# Patient Record
Sex: Male | Born: 2005 | Race: White | Hispanic: No | Marital: Single
Health system: Southern US, Community
[De-identification: ages and names within clinical notes are randomized; demographics above are authoritative.]

## PROBLEM LIST (undated history)

## (undated) DIAGNOSIS — F909 Attention-deficit hyperactivity disorder, unspecified type: Secondary | ICD-10-CM

## (undated) DIAGNOSIS — R569 Unspecified convulsions: Secondary | ICD-10-CM

## (undated) DIAGNOSIS — J45909 Unspecified asthma, uncomplicated: Secondary | ICD-10-CM

## (undated) DIAGNOSIS — J302 Other seasonal allergic rhinitis: Secondary | ICD-10-CM

## (undated) HISTORY — DX: Unspecified convulsions: R56.9

## (undated) HISTORY — PX: PYLOROPLASTY: SHX418

## (undated) HISTORY — DX: Unspecified asthma, uncomplicated: J45.909

## (undated) HISTORY — DX: Attention-deficit hyperactivity disorder, unspecified type: F90.9

## (undated) HISTORY — DX: Other seasonal allergic rhinitis: J30.2

---
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
## 2024-02-01 ENCOUNTER — Encounter (HOSPITAL_COMMUNITY): Payer: Self-pay

## 2024-02-01 ENCOUNTER — Other Ambulatory Visit: Payer: Self-pay

## 2024-02-01 ENCOUNTER — Emergency Department (HOSPITAL_COMMUNITY)
Admission: EM | Admit: 2024-02-01 | Discharge: 2024-02-01 | Disposition: A | Discharge status: Home / Self Care | Payer: MEDICAID | Attending: Emergency Medicine | Admitting: Emergency Medicine

## 2024-02-01 ENCOUNTER — Emergency Department (HOSPITAL_COMMUNITY): Payer: MEDICAID

## 2024-02-01 ENCOUNTER — Other Ambulatory Visit (HOSPITAL_COMMUNITY): Payer: MEDICAID

## 2024-02-01 DIAGNOSIS — W131XXA Fall from, out of or through bridge, initial encounter: Secondary | ICD-10-CM | POA: Insufficient documentation

## 2024-02-01 DIAGNOSIS — J45909 Unspecified asthma, uncomplicated: Secondary | ICD-10-CM | POA: Insufficient documentation

## 2024-02-01 DIAGNOSIS — S52522A Torus fracture of lower end of left radius, initial encounter for closed fracture: Secondary | ICD-10-CM | POA: Diagnosis not present

## 2024-02-01 DIAGNOSIS — S80212A Abrasion, left knee, initial encounter: Secondary | ICD-10-CM | POA: Insufficient documentation

## 2024-02-01 DIAGNOSIS — R2232 Localized swelling, mass and lump, left upper limb: Secondary | ICD-10-CM | POA: Diagnosis present

## 2024-02-01 MED ORDER — ACETAMINOPHEN 500 MG PO TABS
1000.0000 mg | ORAL_TABLET | Freq: Once | ORAL | Status: AC
  Administered 2024-02-01: 1000 mg via ORAL
  Filled 2024-02-01: qty 2

## 2024-02-01 NOTE — ED Triage Notes (Signed)
 PT BIB EMS for a fall from a crossmember of the bridge about 6 feet and landed on rocks.  Left arm swollen and in a splint, pulses noted, able to move fingers, no obvious deformity.  Left knee c/o pain, pulses noted, pelvis and abdomen intact, no loss of consciousness, no head or neck pain.  75 mcg Fentanyl IM 135/81 HR 104  sinus tach CBG 125

## 2024-02-01 NOTE — Discharge Instructions (Signed)
 Thank you for coming to [facility] Emergency Department. You were seen for fall and wrist pain. We did an exam, labs, and imaging, and these showed an impacted radial head fracture (wrist fracture). You were placed in a splint.  Please follow up with orthopedic surgeon Dr. Annamae Barrett within 1 week. You can call his clinic tomorrow morning to make an appointment.  Please rest, ice, and elevate the hand/arm to help with pain and swelling. You can alternate taking Tylenol and ibuprofen as needed for pain. You can take 650mg  tylenol (acetaminophen) every 4-6 hours, and 600 mg ibuprofen 3 times a day.   Do not hesitate to return to the ED or call 911 if you experience: -Worsening symptoms -Numbness/tingling -Cold, blue, numb hand/fingers -Lightheadedness, passing out -Fevers/chills -Anything else that concerns you

## 2024-02-01 NOTE — Progress Notes (Signed)
 Orthopedic Tech Progress Note Patient Details:  Tyler [name] [birth year] [id number]  Ortho Devices Type of Ortho Device: Volar splint Ortho Device/Splint Location: lue Ortho Device/Splint Interventions: Ordered, Application, Adjustment   Post Interventions Patient Tolerated: Well Instructions Provided: Care of device, Adjustment of device  [name] 02/01/2024, 10:23 PM

## 2024-02-01 NOTE — ED Provider Notes (Signed)
 [place] EMERGENCY DEPARTMENT AT [facility] Provider Note   CSN: [id number] Arrival date & time: 02/01/24  1829     History  Chief Complaint  Patient presents with   Tyler [name]    Tyler [name] is a 18 y.o. adult with PMH as listed below who presents BIB EMS for a fall from a crossmember of the bridge about 6 feet and landed on rocks.  Left arm swollen and in a splint by EMS who noted pulses.  Patient reports left knee pain as well.  Has not walked since the incident.  Denies any head trauma or loss of consciousness.  Denies any headache, neck pain, back pain, chest or abdominal pain. 75 mcg Fentanyl IM given PTA.   Past Medical History:  Diagnosis Date   ADHD (attention deficit hyperactivity disorder)    Asthma    Seasonal allergies    Seizures (HCC)        Home Medications Prior to Admission medications   Medication Sig Start Date End Date Taking? Authorizing Provider  cetirizine  HCl (ZYRTEC ) 5 MG/5ML SYRP Take 5 mLs (5 mg total) by mouth daily. 01/20/14   Gaylyn Keas, Mary-Margaret, FNP  ibuprofen (ADVIL,MOTRIN) 200 MG tablet Take 200 mg by mouth every 6 (six) hours as needed for moderate pain.    [provider]  lisdexamfetamine (VYVANSE ) 50 MG capsule Take 1 capsule (50 mg total) by mouth every morning. 06/09/14   Gaylyn Keas, Mary-Margaret, FNP  MELATONIN PO Take 1 tablet by mouth at bedtime.    [provider]  montelukast (SINGULAIR) 5 MG chewable tablet Chew 5 mg by mouth at bedtime.  09/24/16 09/24/17  [provider]  ondansetron  (ZOFRAN ) 4 MG tablet Take 1 tablet (4 mg total) by mouth every 8 (eight) hours as needed for nausea or vomiting. 04/03/23   Rutherford Cowing, MD  PROAIR HFA 108 (90 Base) MCG/ACT inhaler Inhale 2 puffs into the lungs every 4 (four) hours as needed for wheezing. 01/23/17   [provider]  VYVANSE  60 MG capsule Take 60 mg by mouth daily. 02/01/17   [provider]      Allergies    Patient has no allergy  information on record.    Review of Systems   Review of Systems A 10 point review of systems was performed and is negative unless otherwise reported in HPI.  Physical Exam Updated Vital Signs BP 110/67   Pulse 69   Temp 98.3 F (36.8 C) (Oral)   Resp 15   SpO2 98%  Physical Exam General: Normal appearing patient, lying in bed.  HEENT: NCAT. PERRLA, Sclera anicteric, MMM, trachea midline. No midline C spine TTP Cardiology: RRR, no murmurs/rubs/gallops.  Resp: Normal respiratory rate and effort. CTAB, no wheezes, rhonchi, crackles.  Abd: Soft, non-tender, non-distended. No rebound tenderness or guarding.  Pelvis: Pelvis stable and nontender MSK: Tenderness palpation to the left wrist and forearm with mild swelling noted around the wrist.  Range of motion of the elbow and wrist limited due to pain but no tenderness palpation over the elbow, upper arm, shoulder, or clavicle.  Intact distal pulses and sensation with good range of motion of the fingers.  Also mild tender palpation to the anterior left knee with an abrasion that is hemostatic.  Intact range of motion of knee/hip/ankle. Compartments soft in all extremities. Skin: warm, dry.  Back: No CVA tenderness. No midline T or L spine TTP Neuro: A&Ox4, CNs II-XII grossly intact. MAEs. Sensation grossly intact.  Psych:  Normal mood and affect.   ED Results / Procedures / Treatments   Labs (all labs ordered are listed, but only abnormal results are displayed) Labs Reviewed - No data to display  EKG None  Radiology CXR: 1. Low lung volumes. No acute process.   LUE XRs: 1. Mildly impacted fracture of the distal left radius, with slight dorsal angulation at the fracture site. 2. Dorsal soft tissue swelling of the wrist. 3. Otherwise unremarkable left elbow and forearm.  L Knee XR: Unremarkable left knee.   Procedures Procedures    Medications Ordered in ED Medications  acetaminophen  (TYLENOL ) tablet 1,000 mg (1,000 mg  Oral Given 02/01/24 1916)    ED Course/ Medical Decision Making/ A&P                          Medical Decision Making Amount and/or Complexity of Data Reviewed Radiology: ordered. Decision-making details documented in ED Course.  Risk OTC drugs.    This patient presents to the ED for concern of fall, arm/knee pain, this involves an extensive number of treatment options, and is a complaint that carries with it a high risk of complications and morbidity.  I considered the following differential and admission for this acute, potentially life threatening condition. Pt overall very well-appearing, HDS.   MDM:    DDX for trauma includes but is not limited to:  -Head Injury such as skull fx or ICH; vertebral injury - patient rules out by canadian head ct and c-spine criteria. -Fractures -No c/f compartment syndrome, NVI in all extremities.   Clinical Course as of 02/05/24 2001  Sun Feb 01, 2024  2044 DG Knee Complete 4 Views Left 1. Unremarkable left knee. [HN]  2044 DG Elbow Complete Left 1. Mildly impacted fracture of the distal left radius, with slight dorsal angulation at the fracture site. 2. Dorsal soft tissue swelling of the wrist. 3. Otherwise unremarkable left elbow and forearm.   [HN]  2044 DG Chest Portable 1 View 1. Low lung volumes.  No acute process. [HN]    Clinical Course User Index [HN] Merdis Stalling, MD   Patient with mild torus fracture and slight dorsal angulation at the fracture site.  Patient is given a volar splint and instructed to follow-up with orthopedic surgery within 1 week.  Mother at bedside as well.  Instructed for RICE and Tylenol /Motrin at home.   Imaging Studies ordered: I ordered imaging studies including extremity XRs, CXR I independently visualized and interpreted imaging. I agree with the radiologist interpretation  Additional history obtained from chart review, EMS.    Reevaluation: After the interventions noted above, I  reevaluated the patient and found that they have :improved  Social Determinants of Health: Lives independently  Disposition:  DC w/ discharge instructions/return precautions. All questions answered to patient's satisfaction.  Instructed to f/u with orthopedic surgery within 1 week.   Co morbidities that complicate the patient evaluation  Past Medical History:  Diagnosis Date   ADHD (attention deficit hyperactivity disorder)    Asthma    Seasonal allergies    Seizures (HCC)      Medicines Meds ordered this encounter  Medications   acetaminophen  (TYLENOL ) tablet 1,000 mg    I have reviewed the patients home medicines and have made adjustments as needed  Problem List / ED Course: Problem List Items Addressed This Visit   None Visit Diagnoses       Closed torus fracture of distal end of  left radius, initial encounter    -  Primary                   This note was created using dictation software, which may contain spelling or grammatical errors.    Merdis Stalling, MD 02/05/24 2002
